# Patient Record
Sex: Female | Born: 1965 | Race: Black or African American | Hispanic: No | Marital: Single | Smoking: Current every day smoker
Health system: Southern US, Community
[De-identification: ages and names within clinical notes are randomized; demographics above are authoritative.]

## PROBLEM LIST (undated history)

## (undated) DIAGNOSIS — I2699 Other pulmonary embolism without acute cor pulmonale: Secondary | ICD-10-CM

## (undated) DIAGNOSIS — I82409 Acute embolism and thrombosis of unspecified deep veins of unspecified lower extremity: Secondary | ICD-10-CM

## (undated) HISTORY — PX: LAPAROSCOPIC GASTRIC SLEEVE RESECTION: SHX5895

## (undated) HISTORY — PX: PANCREAS SURGERY: SHX731

---

## 2017-01-17 ENCOUNTER — Other Ambulatory Visit: Payer: Self-pay | Admitting: Family Medicine

## 2017-01-17 DIAGNOSIS — Z1231 Encounter for screening mammogram for malignant neoplasm of breast: Secondary | ICD-10-CM

## 2017-01-31 ENCOUNTER — Ambulatory Visit: Payer: Self-pay

## 2017-03-14 ENCOUNTER — Emergency Department (HOSPITAL_BASED_OUTPATIENT_CLINIC_OR_DEPARTMENT_OTHER)
Admit: 2017-03-14 | Discharge: 2017-03-14 | Disposition: A | Discharge status: Home / Self Care | Payer: BLUE CROSS/BLUE SHIELD | Attending: Emergency Medicine | Admitting: Emergency Medicine

## 2017-03-14 ENCOUNTER — Emergency Department (HOSPITAL_COMMUNITY)
Admission: EM | Admit: 2017-03-14 | Discharge: 2017-03-14 | Disposition: A | Discharge status: Home / Self Care | Payer: BLUE CROSS/BLUE SHIELD | Attending: Emergency Medicine | Admitting: Emergency Medicine

## 2017-03-14 ENCOUNTER — Encounter (HOSPITAL_COMMUNITY): Payer: Self-pay | Admitting: Emergency Medicine

## 2017-03-14 DIAGNOSIS — M7989 Other specified soft tissue disorders: Secondary | ICD-10-CM

## 2017-03-14 DIAGNOSIS — M7122 Synovial cyst of popliteal space [Baker], left knee: Secondary | ICD-10-CM | POA: Insufficient documentation

## 2017-03-14 DIAGNOSIS — F17200 Nicotine dependence, unspecified, uncomplicated: Secondary | ICD-10-CM | POA: Insufficient documentation

## 2017-03-14 DIAGNOSIS — M79605 Pain in left leg: Secondary | ICD-10-CM | POA: Diagnosis present

## 2017-03-14 DIAGNOSIS — Z79899 Other long term (current) drug therapy: Secondary | ICD-10-CM | POA: Diagnosis not present

## 2017-03-14 HISTORY — DX: Acute embolism and thrombosis of unspecified deep veins of unspecified lower extremity: I82.409

## 2017-03-14 HISTORY — DX: Other pulmonary embolism without acute cor pulmonale: I26.99

## 2017-03-14 LAB — I-STAT CHEM 8, ED
BUN: 21 mg/dL — AB (ref 6–20)
CHLORIDE: 103 mmol/L (ref 101–111)
Calcium, Ion: 1.13 mmol/L — ABNORMAL LOW (ref 1.15–1.40)
Creatinine, Ser: 0.6 mg/dL (ref 0.44–1.00)
GLUCOSE: 96 mg/dL (ref 65–99)
HCT: 37 % (ref 36.0–46.0)
Hemoglobin: 12.6 g/dL (ref 12.0–15.0)
POTASSIUM: 4 mmol/L (ref 3.5–5.1)
Sodium: 143 mmol/L (ref 135–145)
TCO2: 26 mmol/L (ref 0–100)

## 2017-03-14 NOTE — ED Notes (Signed)
Venous US at bedside.  

## 2017-03-14 NOTE — Progress Notes (Signed)
*  PRELIMINARY RESULTS* Vascular Ultrasound Left lower extremity venous duplex has been completed.  Preliminary findings: No evidence of deep vein thrombosis in the left lower extremity.  Baker's cyst noted in the left popliteal fossa.  Preliminary results given to Dr. Dalene SeltzerSchlossman @ 14:44   Whitney Price 03/14/2017, 2:43 PM

## 2017-03-14 NOTE — ED Provider Notes (Signed)
[facility]-EMERGENCY DEPT Provider Note   CSN: [id number] Arrival date & time: 03/14/17  1131     History   Chief Complaint Chief Complaint  Patient presents with  . Leg Pain    HPI Whitney Price is a 51 y.o. female.  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 Reports that it has been present for about 6 weeks, however has been increasing. She has not yet seen her physician regarding this. Reports the pain is about a 5-6 out of 10. Also reports she's been having chronic daily headaches. Reports they haven't spontaneously, sometimes throughout the day. Denies nausea, vomiting, numbness, weakness, head trauma, fevers. Reports these have also been happening for the last 2 months.  Past Medical History:  Diagnosis Date  . DVT (deep venous thrombosis) (HCC)    Left DVT  . Pulmonary embolus (HCC)     There are no active problems to display for this patient.   Past Surgical History:  Procedure Laterality Date  . CESAREAN SECTION    . LAPAROSCOPIC GASTRIC SLEEVE RESECTION    . PANCREAS SURGERY     cyst removal    OB History    No data available       Home Medications    Prior to Admission medications   Medication Sig Start Date End Date Taking? Authorizing Provider  BIOTIN PO Take 1 tablet by mouth daily.   Yes [provider]  Cholecalciferol (VITAMIN D3 PO) Take 1 capsule by mouth daily.   Yes [provider]  esomeprazole (NEXIUM) 20 MG capsule Take 20 mg by mouth daily at 12 noon.   Yes [provider]  gabapentin (NEURONTIN) 300 MG capsule Take 1 capsule by mouth 3 (three) times daily.  02/24/17  Yes [provider]  MELATONIN PO Take 1 tablet by mouth daily.   Yes [provider]  tiZANidine (ZANAFLEX) 4 MG tablet Take 2 tablets by mouth at bedtime.  03/02/17  Yes [provider]  diclofenac (CATAFLAM) 50 MG tablet Take 1 tablet by mouth daily.  01/09/17   [provider]    Family History Family History  Problem Relation Age of Onset  . Cancer Mother   . Diabetes Mother   . Hypertension Mother   . Hypertension Father   . Cancer Father     Social History Social History  Substance Use Topics  . Smoking status: Current Every Day Smoker    Packs/day: 0.50  . Smokeless tobacco: Never Used  . Alcohol use Yes     Comment: occ     Allergies   Patient has no known allergies.   Review of Systems Review of Systems  Constitutional: Negative for fever.  HENT: Negative for sore throat.   Eyes: Negative for visual disturbance.  Respiratory: Negative for cough and shortness of breath.   Cardiovascular: Positive for leg swelling. Negative for chest pain.  Gastrointestinal: Negative for abdominal pain.  Genitourinary: Negative for difficulty urinating.  Musculoskeletal: Negative for back pain and neck pain.  Skin: Negative for rash.  Neurological: Positive for headaches. Negative for syncope.     Physical Exam Updated Vital Signs BP 127/63 (BP Location: Right Arm)   Pulse (!) 45   Temp 98.4 F (36.9 C)   Resp 18   Wt 76.7 kg (169 lb)   SpO2 100%   Physical Exam  Constitutional: She is oriented to person, place, and time. She  appears well-developed and well-nourished. No distress.  HENT:  Head: Normocephalic and atraumatic.  Eyes: Conjunctivae and EOM are normal.  Neck: Normal range of motion.  Cardiovascular: Normal rate, regular rhythm, normal heart sounds and intact distal pulses.  Exam reveals no gallop and no friction rub.   No murmur heard. Pulmonary/Chest: Effort normal and breath sounds normal. No respiratory distress. She has no wheezes. She has no rales.  Abdominal: Soft. She exhibits no distension. There is no tenderness. There is no guarding.  Musculoskeletal: She exhibits no edema or tenderness.  Neurological: She is alert and oriented to person, place, and time.  Skin: Skin is warm and  dry. No rash noted. She is not diaphoretic. No erythema.  Nursing note and vitals reviewed.    ED Treatments / Results  Labs (all labs ordered are listed, but only abnormal results are displayed) Labs Reviewed  I-STAT CHEM 8, ED - Abnormal; Notable for the following:       Result Value   BUN 21 (*)    Calcium, Ion 1.13 (*)    All other components within normal limits    EKG  EKG Interpretation None       Radiology No results found.  Procedures Procedures (including critical care time)  Medications Ordered in ED Medications - No data to display   Initial Impression / Assessment and Plan / ED Course  I have reviewed the triage vital signs and the nursing notes.  Pertinent labs & imaging results that were available during my care of the patient were reviewed by me and considered in my medical decision making (see chart for details).     51yo female with history of DVT/PE presents with concern for left lower extremity pain and swelling.  DVT study negative and shows baker's cyst. Patient also reports headaches which are intermittent.  Headache began slowly, no trauma, no fevers, and normal neurologic exam and have low suspicion for [facility], SDH or meningitis. Denies current dyspnea, chest pain. Reports occasional dyspnea at night however no orthopnea. History and exam do not show signs of CHF, pneumonia, pneumothorax. Doubt PE given intermittent symptoms, no exertional symptoms, no hypoxia, no tachycardia, no tachypnea, no DVT.  Recommend close PCP follow up, consideration of outpatient imaging for headaches.  Patient discharged in stable condition with understanding of reasons to return.   Final Clinical Impressions(s) / ED Diagnoses   Final diagnoses:  Baker's cyst of knee, left    New Prescriptions Discharge Medication List as of 03/14/2017  3:26 PM       Alvira MondaySchlossman, Josely Moffat, MD 03/14/17 2320

## 2017-03-14 NOTE — ED Triage Notes (Signed)
Pt reports pain in l/leg behind he l/knee x 6 weeks. Pt stated that she has been treated for DVT and PE years ago. Denies chest pain, denies shortness of breath. Pain and swelling  in leg has increased over last 2 days. Pt is alert, oriented and ambulatory. Pt is requesting an ultrasound. Has not seen PCP for this concern.

## 2019-01-21 ENCOUNTER — Other Ambulatory Visit: Payer: Self-pay

## 2019-01-21 ENCOUNTER — Emergency Department (HOSPITAL_COMMUNITY): Payer: BC Managed Care – PPO

## 2019-01-21 ENCOUNTER — Emergency Department (HOSPITAL_COMMUNITY)
Admission: EM | Admit: 2019-01-21 | Discharge: 2019-01-21 | Disposition: A | Discharge status: Home / Self Care | Payer: BC Managed Care – PPO | Attending: Emergency Medicine | Admitting: Emergency Medicine

## 2019-01-21 ENCOUNTER — Encounter (HOSPITAL_COMMUNITY): Payer: Self-pay | Admitting: Emergency Medicine

## 2019-01-21 DIAGNOSIS — Z79899 Other long term (current) drug therapy: Secondary | ICD-10-CM | POA: Diagnosis not present

## 2019-01-21 DIAGNOSIS — R072 Precordial pain: Secondary | ICD-10-CM | POA: Diagnosis not present

## 2019-01-21 DIAGNOSIS — R519 Headache, unspecified: Secondary | ICD-10-CM

## 2019-01-21 DIAGNOSIS — F17210 Nicotine dependence, cigarettes, uncomplicated: Secondary | ICD-10-CM | POA: Diagnosis not present

## 2019-01-21 DIAGNOSIS — R51 Headache: Secondary | ICD-10-CM | POA: Diagnosis present

## 2019-01-21 LAB — CBC WITH DIFFERENTIAL/PLATELET
Abs Immature Granulocytes: 0.01 10*3/uL (ref 0.00–0.07)
Basophils Absolute: 0 10*3/uL (ref 0.0–0.1)
Basophils Relative: 1 %
Eosinophils Absolute: 0.1 10*3/uL (ref 0.0–0.5)
Eosinophils Relative: 2 %
HCT: 36.4 % (ref 36.0–46.0)
Hemoglobin: 11.9 g/dL — ABNORMAL LOW (ref 12.0–15.0)
Immature Granulocytes: 0 %
Lymphocytes Relative: 41 %
Lymphs Abs: 1.8 10*3/uL (ref 0.7–4.0)
MCH: 31.1 pg (ref 26.0–34.0)
MCHC: 32.7 g/dL (ref 30.0–36.0)
MCV: 95 fL (ref 80.0–100.0)
Monocytes Absolute: 0.4 10*3/uL (ref 0.1–1.0)
Monocytes Relative: 9 %
Neutro Abs: 2 10*3/uL (ref 1.7–7.7)
Neutrophils Relative %: 47 %
Platelets: 183 10*3/uL (ref 150–400)
RBC: 3.83 MIL/uL — ABNORMAL LOW (ref 3.87–5.11)
RDW: 13.6 % (ref 11.5–15.5)
WBC: 4.3 10*3/uL (ref 4.0–10.5)
nRBC: 0 % (ref 0.0–0.2)

## 2019-01-21 LAB — BASIC METABOLIC PANEL
Anion gap: 7 (ref 5–15)
BUN: 26 mg/dL — ABNORMAL HIGH (ref 6–20)
CO2: 25 mmol/L (ref 22–32)
Calcium: 8.7 mg/dL — ABNORMAL LOW (ref 8.9–10.3)
Chloride: 108 mmol/L (ref 98–111)
Creatinine, Ser: 0.59 mg/dL (ref 0.44–1.00)
GFR calc Af Amer: >60 mL/min (ref >60)
GFR calc non Af Amer: >60 mL/min (ref >60)
Glucose, Bld: 94 mg/dL (ref 70–99)
Potassium: 4 mmol/L (ref 3.5–5.1)
Sodium: 140 mmol/L (ref 135–145)

## 2019-01-21 LAB — TROPONIN I
Troponin I: <0.03 ng/mL (ref <0.03)
Troponin I: <0.03 ng/mL (ref <0.03)

## 2019-01-21 MED ORDER — METOCLOPRAMIDE HCL 5 MG/ML IJ SOLN
10.0000 mg | Freq: Once | INTRAMUSCULAR | Status: AC
  Administered 2019-01-21: 10 mg via INTRAVENOUS
  Filled 2019-01-21: qty 2

## 2019-01-21 MED ORDER — DIPHENHYDRAMINE HCL 50 MG/ML IJ SOLN
25.0000 mg | Freq: Once | INTRAMUSCULAR | Status: AC
  Administered 2019-01-21: 25 mg via INTRAVENOUS
  Filled 2019-01-21: qty 1

## 2019-01-21 MED ORDER — SODIUM CHLORIDE 0.9 % IV BOLUS (SEPSIS)
1000.0000 mL | Freq: Once | INTRAVENOUS | Status: AC
  Administered 2019-01-21: 06:00:00 1000 mL via INTRAVENOUS

## 2019-01-21 NOTE — ED Provider Notes (Signed)
Headache improved at this time.  EKG without ischemic changes.  Troponin negative x2.  Sinus bradycardia.  Overall well-appearing.  No syncope.  Close primary care follow-up.  Patient understands return to the ER for new or worsening symptoms.   Jola Schmidt, MD 01/21/19 1010

## 2019-01-21 NOTE — ED Provider Notes (Signed)
[facility] DEPT Provider Note   CSN: [id number] Arrival date & time: 01/21/19  0136    History   Chief Complaint Chief Complaint  Patient presents with  . Headache    HPI Whitney Price is a 53 y.o. female.     The history is provided by the patient.  Headache  Pain location:  Generalized Quality:  Dull Onset quality:  Gradual Timing:  Constant Progression:  Unchanged Chronicity:  New Relieved by:  Nothing Worsened by:  Nothing Associated symptoms: no fever, no vomiting and no weakness   She presents for multiple complaints.  She reports approximately 5 hours ago she had onset of generalized headache, numbness in her fingers, chest heaviness and shortness of breath, and left groin pain. No fevers or vomiting  she reports history of migraines, but this headache felt different.  She does not recall having this chest pain before.  She reports distant history of a PE but this is not similar.  She is no longer on anticoagulants as it was over 10 years ago She reports numbness is improved, but still has headache and chest heaviness Past Medical History:  Diagnosis Date  . DVT (deep venous thrombosis) (HCC)    Left DVT  . Pulmonary embolus (HCC)     There are no active problems to display for this patient.   Past Surgical History:  Procedure Laterality Date  . CESAREAN SECTION    . LAPAROSCOPIC GASTRIC SLEEVE RESECTION    . PANCREAS SURGERY     cyst removal     OB History   No obstetric history on file.      Home Medications    Prior to Admission medications   Medication Sig Start Date End Date Taking? Authorizing Provider  celecoxib (CELEBREX) 200 MG capsule Take 200 mg by mouth 2 (two) times a day. 01/15/19  Yes [provider]  clonazePAM (KLONOPIN) 0.5 MG tablet Take 0.5 mg by mouth 2 (two) times daily as needed for anxiety.  10/27/18  Yes [provider]  esomeprazole (NEXIUM) 20 MG capsule Take 20 mg by mouth  daily after breakfast.    Yes [provider]  gabapentin (NEURONTIN) 300 MG capsule Take 300 mg by mouth 3 (three) times daily as needed (pain).  02/24/17  Yes [provider]  hydrOXYzine (ATARAX/VISTARIL) 25 MG tablet Take 25 mg by mouth every 6 (six) hours as needed. 11/03/18  Yes [provider]  tiZANidine (ZANAFLEX) 4 MG tablet Take 4 tablets by mouth at bedtime.  03/02/17  Yes [provider]    Family History Family History  Problem Relation Age of Onset  . Cancer Mother   . Diabetes Mother   . Hypertension Mother   . Hypertension Father   . Cancer Father     Social History Social History   Tobacco Use  . Smoking status: Current Every Day Smoker    Packs/day: 0.50  . Smokeless tobacco: Never Used  Substance Use Topics  . Alcohol use: Yes    Comment: occ  . Drug use: No     Allergies   Patient has no known allergies.   Review of Systems Review of Systems  Constitutional: Negative for fever.  Respiratory: Positive for shortness of breath.   Gastrointestinal: Negative for vomiting.  Neurological: Positive for headaches. Negative for syncope and weakness.  All other systems reviewed and are negative.    Physical Exam Updated Vital Signs BP 117/79   Pulse (!) 54  Temp 98.4 F (36.9 C) (Oral)   Resp 15   Ht 1.549 m (5\' 1" )   Wt 72.1 kg   SpO2 95%   BMI 30.04 kg/m   Physical Exam CONSTITUTIONAL: Well developed/well nourished HEAD: Normocephalic/atraumatic EYES: EOMI/PERRL, no nystagmus, no ptosis  ENMT: Mucous membranes moist NECK: supple no meningeal signs, no bruits SPINE/BACK:entire spine nontender CV: S1/S2 noted, no murmurs/rubs/gallops noted LUNGS: Lungs are clear to auscultation bilaterally, no apparent distress ABDOMEN: soft, nontender, no rebound or guarding GU:no cva tenderness NEURO:Awake/alert, face symmetric, no arm or leg drift is noted Equal 5/5 strength with shoulder abduction, elbow  flex/extension, wrist flex/extension in upper extremities and equal hand grips bilaterally Equal 5/5 strength with hip flexion,knee flex/extension, foot dorsi/plantar flexion Cranial nerves 3/4/5/6/02/20/09/11/12 tested and intact Sensation to light touch intact in all extremities EXTREMITIES: pulses normal, full ROM, no LE edema/tenderness Mild tenderness to left inguinal region, no erythema/warmth/lymphadenopathy.  Female nurse chaperone present SKIN: warm, color normal PSYCH: no abnormalities of mood noted, alert and oriented to situation    ED Treatments / Results  Labs (all labs ordered are listed, but only abnormal results are displayed) Labs Reviewed  BASIC METABOLIC PANEL - Abnormal; Notable for the following components:      Result Value   BUN 26 (*)    Calcium 8.7 (*)    All other components within normal limits  CBC WITH DIFFERENTIAL/PLATELET - Abnormal; Notable for the following components:   RBC 3.83 (*)    Hemoglobin 11.9 (*)    All other components within normal limits  TROPONIN I    EKG EKG Interpretation  Date/Time:  Monday January 21 2019 05:34:45 EDT Ventricular Rate:  46 PR Interval:    QRS Duration: 91 QT Interval:  469 QTC Calculation: 411 R Axis:   2 Text Interpretation:  Sinus bradycardia Anteroseptal infarct, age indeterminate Abnormal ekg No previous ECGs available Confirmed by Zadie RhineWickline, Axell Trigueros ([id number]) on 01/21/2019 5:42:19 AM   Radiology Dg Chest Port 1 View  Result Date: 01/21/2019 CLINICAL DATA:  Headaches. Left-sided groin pain. Numbness in the finger tips. EXAM: PORTABLE CHEST 1 VIEW COMPARISON:  None. FINDINGS: The heart size is normal. Lung volumes are low. There is no edema or effusion. No focal airspace disease is present. The visualized soft tissues and bony thorax are unremarkable. IMPRESSION: Negative one-view chest x-ray Electronically Signed   By: Marin Robertshristopher  Mattern M.D.   On: 01/21/2019 06:32  reviewed chest x-ray with radiology, no acute  findings.  Procedures Procedures    Medications Ordered in ED Medications  metoCLOPramide (REGLAN) injection 10 mg (10 mg Intravenous Given 01/21/19 0554)  sodium chloride 0.9 % bolus 1,000 mL (1,000 mLs Intravenous New Bag/Given 01/21/19 0605)  diphenhydrAMINE (BENADRYL) injection 25 mg (25 mg Intravenous Given 01/21/19 0605)     Initial Impression / Assessment and Plan / ED Course  I have reviewed the triage vital signs and the nursing notes.  Pertinent labs & imaging results that were available during my care of the patient were reviewed by me and considered in my medical decision making (see chart for details).        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 6:53 AM Patient reports that she had [place] cardiac [place] in [place] in [place]. 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  She reports she had no blockages 7:11 AM Feeling improved. She is in no acute distress.  EKG shows T wave emergency, but no old to compare.  Initial troponin negative. Plan at signout to Dr. Patria Maneampos is to follow-up on repeat troponin.  If negative she can follow-up as an outpatient Final Clinical Impressions(s) / ED Diagnoses   Final diagnoses:  None    ED Discharge Orders    None       Zadie RhineWickline, Senaya Dicenso, MD 01/21/19 [phone]

## 2019-01-21 NOTE — ED Triage Notes (Signed)
Pt presents by [facility] for evaluation of headache with left sided groin pain and numbness in fingertips. Pt reports headache that started around 2300.

## 2019-01-21 NOTE — ED Notes (Signed)
ED Provider at bedside. Assisted provider with physical exam.

## 2019-05-10 ENCOUNTER — Other Ambulatory Visit: Payer: Self-pay

## 2019-05-10 ENCOUNTER — Emergency Department (HOSPITAL_COMMUNITY)
Admission: EM | Admit: 2019-05-10 | Discharge: 2019-05-10 | Disposition: A | Discharge status: Home / Self Care | Payer: BC Managed Care – PPO | Attending: Emergency Medicine | Admitting: Emergency Medicine

## 2019-05-10 ENCOUNTER — Encounter (HOSPITAL_COMMUNITY): Payer: Self-pay

## 2019-05-10 DIAGNOSIS — M549 Dorsalgia, unspecified: Secondary | ICD-10-CM | POA: Diagnosis present

## 2019-05-10 DIAGNOSIS — Z9884 Bariatric surgery status: Secondary | ICD-10-CM | POA: Diagnosis not present

## 2019-05-10 DIAGNOSIS — M545 Low back pain, unspecified: Secondary | ICD-10-CM

## 2019-05-10 DIAGNOSIS — Z86711 Personal history of pulmonary embolism: Secondary | ICD-10-CM | POA: Diagnosis not present

## 2019-05-10 DIAGNOSIS — Z79899 Other long term (current) drug therapy: Secondary | ICD-10-CM | POA: Insufficient documentation

## 2019-05-10 DIAGNOSIS — Z86718 Personal history of other venous thrombosis and embolism: Secondary | ICD-10-CM | POA: Insufficient documentation

## 2019-05-10 DIAGNOSIS — F17210 Nicotine dependence, cigarettes, uncomplicated: Secondary | ICD-10-CM | POA: Diagnosis not present

## 2019-05-10 MED ORDER — HYDROCODONE-ACETAMINOPHEN 5-325 MG PO TABS: 1.0000 | ORAL_TABLET | Freq: Four times a day (QID) | ORAL | 0 refills | Status: AC | PRN

## 2019-05-10 MED ORDER — METHYLPREDNISOLONE 4 MG PO TBPK: ORAL_TABLET | ORAL | 0 refills | Status: AC

## 2019-05-10 NOTE — ED Provider Notes (Signed)
[facility] EMERGENCY DEPARTMENT Provider Note   CSN: [id number] Arrival date & time: 05/10/19  1340     History   Chief Complaint Chief Complaint  Patient presents with  . Back Pain    HPI Whitney Price is a 53 y.o. female.     The history is provided by the patient and medical records. No language interpreter was used.   Whitney Price is a 53 y.o. female  with a PMH as listed below who presents to the Emergency Department complaining of persistent low back pain 3 weeks.  Patient reports history of similar.  Does feel similar to her previous, however typically resolves with OTC medications.  She has been taking Tylenol and using BenGay with little improvement.  Patient denies upper back or neck pain. No fever, saddle anesthesia, weakness, numbness, urinary complaints including retention/incontinence. No history of cancer, IVDU, or recent spinal procedures.  Had a virtual visit with her primary care doctor on 9/14 where she was told to continue Tylenol/ibuprofen and tramadol was added into regimen.  MRI was ordered and per patient, has been approved by [employer], but has not been scheduled yet.  Past Medical History:  Diagnosis Date  . DVT (deep venous thrombosis) (HCC)    Left DVT  . Pulmonary embolus (HCC)     There are no active problems to display for this patient.   Past Surgical History:  Procedure Laterality Date  . CESAREAN SECTION    . LAPAROSCOPIC GASTRIC SLEEVE RESECTION    . PANCREAS SURGERY     cyst removal     OB History   No obstetric history on file.      Home Medications    Prior to Admission medications   Medication Sig Start Date End Date Taking? Authorizing Provider  celecoxib (CELEBREX) 200 MG capsule Take 200 mg by mouth 2 (two) times a day. 01/15/19   [provider]  clonazePAM (KLONOPIN) 0.5 MG tablet Take 0.5 mg by mouth 2 (two) times daily as needed for anxiety.  10/27/18   [provider]   esomeprazole (NEXIUM) 20 MG capsule Take 20 mg by mouth daily after breakfast.     [provider]  gabapentin (NEURONTIN) 300 MG capsule Take 300 mg by mouth 3 (three) times daily as needed (pain).  02/24/17   [provider]  HYDROcodone-acetaminophen (NORCO) 5-325 MG tablet Take 1 tablet by mouth every 6 (six) hours as needed for moderate pain. 05/10/19   Robbi Scurlock, Chase Picket, PA-C  hydrOXYzine (ATARAX/VISTARIL) 25 MG tablet Take 25 mg by mouth every 6 (six) hours as needed. 11/03/18   [provider]  methylPREDNISolone (MEDROL DOSEPAK) 4 MG TBPK tablet Take as directed on package. 05/10/19   Njeri Vicente, Chase Picket, PA-C  tiZANidine (ZANAFLEX) 4 MG tablet Take 4 tablets by mouth at bedtime.  03/02/17   [provider]    Family History Family History  Problem Relation Age of Onset  . Cancer Mother   . Diabetes Mother   . Hypertension Mother   . Hypertension Father   . Cancer Father     Social History Social History   Tobacco Use  . Smoking status: Current Every Day Smoker    Packs/day: 0.50  . Smokeless tobacco: Never Used  Substance Use Topics  . Alcohol use: Yes    Comment: occ  . Drug use: No     Allergies   Patient has no known allergies.   Review of Systems Review of  Systems  Constitutional: Negative for chills and fever.  Cardiovascular: Negative for chest pain.  Gastrointestinal: Negative for abdominal pain, nausea and vomiting.  Genitourinary: Negative for difficulty urinating, dysuria and frequency.  Musculoskeletal: Positive for back pain. Negative for neck pain.  Skin: Negative for color change and wound.     Physical Exam Updated Vital Signs BP (!) 146/79 (BP Location: Right Arm)   Pulse (!) 54   Temp 99.2 F (37.3 C) (Oral)   Resp 15   SpO2 100%   Physical Exam Vitals signs and nursing note reviewed.  Constitutional:      Appearance: She is well-developed.  Neck:     Comments: No midline or paraspinal  tenderness. Full ROM without pain. Cardiovascular:     Rate and Rhythm: Normal rate and regular rhythm.     Heart sounds: Normal heart sounds.  Pulmonary:     Effort: Pulmonary effort is normal. No respiratory distress.     Breath sounds: Normal breath sounds.  Abdominal:     General: Bowel sounds are normal. There is no distension.     Palpations: Abdomen is soft.     Tenderness: There is no abdominal tenderness.  Musculoskeletal:       Back:     Comments: Diffuse tenderness to the T and L-spine as depicted in image. 5/5 muscle strength in bilateral LE's. Straight leg raises are negative bilaterally for radicular symptoms. Able to ambulate independently with steady gait.  Skin:    General: Skin is warm and dry.     Findings: No erythema or rash.  Neurological:     Mental Status: She is alert and oriented to person, place, and time.     Deep Tendon Reflexes: Reflexes are normal and symmetric.     Comments: Bilateral lower extremities neurovascularly intact.      ED Treatments / Results  Labs (all labs ordered are listed, but only abnormal results are displayed) Labs Reviewed - No data to display  EKG None  Radiology No results found.  Procedures Procedures (including critical care time)  Medications Ordered in ED Medications - No data to display   Initial Impression / Assessment and Plan / ED Course  I have reviewed the triage vital signs and the nursing notes.  Pertinent labs & imaging results that were available during my care of the patient were reviewed by me and considered in my medical decision making (see chart for details).       Whitney Price is a 53 y.o. female who presents to ED for mid-to-lower back pain. Patient demonstrates no lower extremity weakness, saddle anesthesia, bowel or bladder incontinence or neuro deficits. No concern for cauda equina. No fevers or other infectious symptoms to suggest that the patient's back pain is due to an infection.  Lower extremities are neurovascularly intact and patient is ambulating without difficulty.  She does have a primary care doctor who has ordered an outpatient MRI which is what I feel she ultimately needs.  No indication for emergent MRI to be done today.  We discussed x-rays which she has had in the past, but has been several years.  Shared decision making and have agreed that ultimately she does need MRI and will hold off on doing x-rays today.  I have reviewed return precautions, including the development of any of these signs or symptoms and the patient has voiced understanding. I reviewed symptomatic home care instructions and PCP follow-up if symptoms do not improve. RX for medrol dose pack and  short course of norco. Patient voiced understanding and agreement with plan. All questions answered.   Final Clinical Impressions(s) / ED Diagnoses   Final diagnoses:  Bilateral low back pain without sciatica, unspecified chronicity    ED Discharge Orders         Ordered    methylPREDNISolone (MEDROL DOSEPAK) 4 MG TBPK tablet     05/10/19 1544    HYDROcodone-acetaminophen (NORCO) 5-325 MG tablet  Every 6 hours PRN     05/10/19 1544           Chaundra Abreu, Chase PicketJaime Pilcher, PA-C 05/10/19 1557    Virgina Norfolkuratolo, Adam, DO 05/10/19 1715

## 2019-05-10 NOTE — Discharge Instructions (Signed)
It was my pleasure taking care of you today!   Call your primary care doctor on Monday to schedule a follow up appointment.   Return to the ED for worsening back pain, fever, weakness or numbness of either leg, or if you develop either (1) an inability to urinate or have bowel movements, or (2) loss of your ability to control your bathroom functions (if you start having "accidents"), or if you develop other new symptoms that concern you.

## 2019-05-10 NOTE — ED Notes (Signed)
Patient verbalizes understanding of discharge instructions. Opportunity for questioning and answers were provided. Armband removed by staff, pt discharged from ED.  

## 2019-05-10 NOTE — ED Triage Notes (Signed)
Pt c.o lower back pain for 3 weeks but worse today. Worse with movement. Denies any recent falls or injury.

## 2019-08-14 ENCOUNTER — Emergency Department (HOSPITAL_COMMUNITY): Payer: BC Managed Care – PPO

## 2019-08-14 ENCOUNTER — Emergency Department (HOSPITAL_COMMUNITY)
Admission: EM | Admit: 2019-08-14 | Discharge: 2019-08-14 | Disposition: A | Discharge status: Home / Self Care | Payer: BC Managed Care – PPO | Attending: Emergency Medicine | Admitting: Emergency Medicine

## 2019-08-14 ENCOUNTER — Encounter (HOSPITAL_COMMUNITY): Payer: Self-pay | Admitting: Emergency Medicine

## 2019-08-14 ENCOUNTER — Other Ambulatory Visit: Payer: Self-pay

## 2019-08-14 DIAGNOSIS — Z79899 Other long term (current) drug therapy: Secondary | ICD-10-CM | POA: Diagnosis not present

## 2019-08-14 DIAGNOSIS — K5792 Diverticulitis of intestine, part unspecified, without perforation or abscess without bleeding: Secondary | ICD-10-CM

## 2019-08-14 DIAGNOSIS — R110 Nausea: Secondary | ICD-10-CM | POA: Diagnosis not present

## 2019-08-14 DIAGNOSIS — F17210 Nicotine dependence, cigarettes, uncomplicated: Secondary | ICD-10-CM | POA: Insufficient documentation

## 2019-08-14 DIAGNOSIS — R630 Anorexia: Secondary | ICD-10-CM | POA: Diagnosis not present

## 2019-08-14 DIAGNOSIS — R1031 Right lower quadrant pain: Secondary | ICD-10-CM

## 2019-08-14 LAB — URINALYSIS, ROUTINE W REFLEX MICROSCOPIC
Bilirubin Urine: NEGATIVE
Glucose, UA: NEGATIVE mg/dL
Hgb urine dipstick: NEGATIVE
Ketones, ur: NEGATIVE mg/dL
Nitrite: POSITIVE — AB
Protein, ur: NEGATIVE mg/dL
Specific Gravity, Urine: 1.026 (ref 1.005–1.030)
pH: 5 (ref 5.0–8.0)

## 2019-08-14 LAB — CBC
HCT: 36 % (ref 36.0–46.0)
Hemoglobin: 12 g/dL (ref 12.0–15.0)
MCH: 32.1 pg (ref 26.0–34.0)
MCHC: 33.3 g/dL (ref 30.0–36.0)
MCV: 96.3 fL (ref 80.0–100.0)
Platelets: 162 10*3/uL (ref 150–400)
RBC: 3.74 MIL/uL — ABNORMAL LOW (ref 3.87–5.11)
RDW: 13.8 % (ref 11.5–15.5)
WBC: 5.7 10*3/uL (ref 4.0–10.5)
nRBC: 0 % (ref 0.0–0.2)

## 2019-08-14 LAB — COMPREHENSIVE METABOLIC PANEL
ALT: 11 U/L (ref 0–44)
AST: 16 U/L (ref 15–41)
Albumin: 3.6 g/dL (ref 3.5–5.0)
Alkaline Phosphatase: 45 U/L (ref 38–126)
Anion gap: 8 (ref 5–15)
BUN: 16 mg/dL (ref 6–20)
CO2: 29 mmol/L (ref 22–32)
Calcium: 8.7 mg/dL — ABNORMAL LOW (ref 8.9–10.3)
Chloride: 102 mmol/L (ref 98–111)
Creatinine, Ser: 0.64 mg/dL (ref 0.44–1.00)
GFR calc Af Amer: >60 mL/min (ref >60)
GFR calc non Af Amer: >60 mL/min (ref >60)
Glucose, Bld: 93 mg/dL (ref 70–99)
Potassium: 3.7 mmol/L (ref 3.5–5.1)
Sodium: 139 mmol/L (ref 135–145)
Total Bilirubin: 0.7 mg/dL (ref 0.3–1.2)
Total Protein: 6.5 g/dL (ref 6.5–8.1)

## 2019-08-14 LAB — LIPASE, BLOOD: Lipase: 29 U/L (ref 11–51)

## 2019-08-14 LAB — I-STAT BETA HCG BLOOD, ED (MC, WL, AP ONLY): I-stat hCG, quantitative: <5 m[IU]/mL (ref <5)

## 2019-08-14 MED ORDER — OXYCODONE-ACETAMINOPHEN 5-325 MG PO TABS: 1.0000 | ORAL_TABLET | ORAL | 0 refills | Status: AC | PRN

## 2019-08-14 MED ORDER — SODIUM CHLORIDE (PF) 0.9 % IJ SOLN
INTRAMUSCULAR | Status: AC
  Filled 2019-08-14: qty 50

## 2019-08-14 MED ORDER — METRONIDAZOLE 500 MG PO TABS: 500.0000 mg | ORAL_TABLET | Freq: Three times a day (TID) | ORAL | 0 refills | Status: DC

## 2019-08-14 MED ORDER — OXYCODONE-ACETAMINOPHEN 5-325 MG PO TABS: 1.0000 | ORAL_TABLET | ORAL | 0 refills | Status: DC | PRN

## 2019-08-14 MED ORDER — METRONIDAZOLE 500 MG PO TABS: 500.0000 mg | ORAL_TABLET | Freq: Three times a day (TID) | ORAL | 0 refills | Status: AC

## 2019-08-14 MED ORDER — IOHEXOL 300 MG/ML  SOLN
100.0000 mL | Freq: Once | INTRAMUSCULAR | Status: AC | PRN
  Administered 2019-08-14: 100 mL via INTRAVENOUS

## 2019-08-14 MED ORDER — SENNOSIDES-DOCUSATE SODIUM 8.6-50 MG PO TABS: 1.0000 | ORAL_TABLET | Freq: Every day | ORAL | 0 refills | Status: AC

## 2019-08-14 MED ORDER — CIPROFLOXACIN HCL 500 MG PO TABS: 500.0000 mg | ORAL_TABLET | Freq: Two times a day (BID) | ORAL | 0 refills | Status: DC

## 2019-08-14 MED ORDER — MORPHINE SULFATE (PF) 4 MG/ML IV SOLN
4.0000 mg | Freq: Once | INTRAVENOUS | Status: AC
Start: 2019-08-14 — End: 2019-08-14
  Administered 2019-08-14: 4 mg via INTRAVENOUS
  Filled 2019-08-14: qty 1

## 2019-08-14 MED ORDER — CIPROFLOXACIN HCL 500 MG PO TABS: 500.0000 mg | ORAL_TABLET | Freq: Two times a day (BID) | ORAL | 0 refills | Status: AC

## 2019-08-14 MED ORDER — SODIUM CHLORIDE 0.9 % IV BOLUS
1000.0000 mL | Freq: Once | INTRAVENOUS | Status: AC
  Administered 2019-08-14: 1000 mL via INTRAVENOUS

## 2019-08-14 MED ORDER — SENNOSIDES-DOCUSATE SODIUM 8.6-50 MG PO TABS: 1.0000 | ORAL_TABLET | Freq: Every day | ORAL | 0 refills | Status: DC

## 2019-08-14 NOTE — Discharge Instructions (Signed)
I have prescribed antibiotics to help treat your infection.  Flagyl should be taken, 1 tablet 3 times a day for the next 7 days.  Cipro should be taken 1 tablet 2 times a day for the next 7 days.  I have also prescribed percocet for your pain. Please take this medication with Senna as it can make you constipated.   If you experience any nausea, vomiting, fever please return to the emergency department.

## 2019-08-14 NOTE — ED Provider Notes (Signed)
[facility]-EMERGENCY DEPT Provider Note   CSN: [id number] Arrival date & time: 08/14/19  1603     History Chief Complaint  Patient presents with  . Abdominal Pain    Whitney Price is a 53 y.o. female.  53 y.o female with a PMH of DVT, PE resents to the ED with a chief complaint of abdominal pain x3 days.  Patient describes a constant stabbing sensation to the right lower quadrant, she reports this pain is exacerbated with palpation, movement, walking.  Patient was evaluated by PCP today in office, was sent to urgent care however due to gravity of situation she was referred to the ED via EMS.  Reports taking Tylenol with codeine to help with her pain without improvement in symptoms.  Does endorse some anorexia, states her last meal was yesterday and basically only consisted of snacks.  She is also had some nausea, chills.  Her last bowel movement was in the morning without any blood.  According to patient she mostly stays at home, has not been around anybody who is been sick.  There is no alleviating factors.  She denies any urinary symptoms, fever, gynecological complaints.  Of note, she does have a prior history of a C-section, along with bariatric intervention.   The history is provided by the patient and medical records.  Abdominal Pain Pain location:  RLQ Pain quality: sharp   Pain radiates to:  Does not radiate Pain severity:  Moderate Onset quality:  Sudden Duration:  3 days Timing:  Constant Progression:  Worsening Context: not diet changes and not laxative use   Associated symptoms: nausea and vomiting   Associated symptoms: no chest pain, no diarrhea, no fever, no shortness of breath and no sore throat        Past Medical History:  Diagnosis Date  . DVT (deep venous thrombosis) (HCC)    Left DVT  . Pulmonary embolus (HCC)     There are no problems to display for this patient.   Past Surgical History:  Procedure Laterality Date  . CESAREAN  SECTION    . LAPAROSCOPIC GASTRIC SLEEVE RESECTION    . PANCREAS SURGERY     cyst removal     OB History   No obstetric history on file.     Family History  Problem Relation Age of Onset  . Cancer Mother   . Diabetes Mother   . Hypertension Mother   . Hypertension Father   . Cancer Father     Social History   Tobacco Use  . Smoking status: Current Every Day Smoker    Packs/day: 0.50  . Smokeless tobacco: Never Used  Substance Use Topics  . Alcohol use: Yes    Comment: occ  . Drug use: No    Home Medications Prior to Admission medications   Medication Sig Start Date End Date Taking? Authorizing Provider  celecoxib (CELEBREX) 200 MG capsule Take 200 mg by mouth 2 (two) times a day. 01/15/19   [provider]  ciprofloxacin (CIPRO) 500 MG tablet Take 1 tablet (500 mg total) by mouth 2 (two) times daily for 7 days. 08/14/19 08/21/19  Claude Manges, PA-C  clonazePAM (KLONOPIN) 0.5 MG tablet Take 0.5 mg by mouth 2 (two) times daily as needed for anxiety.  10/27/18   [provider]  esomeprazole (NEXIUM) 20 MG capsule Take 20 mg by mouth daily after breakfast.     [provider]  gabapentin (NEURONTIN) 300 MG capsule Take 300 mg by  mouth 3 (three) times daily as needed (pain).  02/24/17   [provider]  HYDROcodone-acetaminophen (NORCO) 5-325 MG tablet Take 1 tablet by mouth every 6 (six) hours as needed for moderate pain. 05/10/19   Ward, Chase PicketJaime Pilcher, PA-C  hydrOXYzine (ATARAX/VISTARIL) 25 MG tablet Take 25 mg by mouth every 6 (six) hours as needed. 11/03/18   [provider]  methylPREDNISolone (MEDROL DOSEPAK) 4 MG TBPK tablet Take as directed on package. 05/10/19   Ward, Chase PicketJaime Pilcher, PA-C  metroNIDAZOLE (FLAGYL) 500 MG tablet Take 1 tablet (500 mg total) by mouth 3 (three) times daily for 7 days. 08/14/19 08/21/19  Claude MangesSoto, Anjelica Gorniak, PA-C  oxyCODONE-acetaminophen (PERCOCET/ROXICET) 5-325 MG tablet Take 1 tablet by mouth every 4 (four) hours  as needed for up to 3 days for severe pain. 08/14/19 08/17/19  Claude MangesSoto, Shanetha Bradham, PA-C  senna-docusate (SENOKOT-S) 8.6-50 MG tablet Take 1 tablet by mouth daily for 10 days. 08/14/19 08/24/19  Claude MangesSoto, Maxwell Lemen, PA-C  tiZANidine (ZANAFLEX) 4 MG tablet Take 4 tablets by mouth at bedtime.  03/02/17   [provider]    Allergies    Patient has no known allergies.  Review of Systems   Review of Systems  Constitutional: Negative for fever.  HENT: Negative for rhinorrhea and sore throat.   Respiratory: Negative for shortness of breath.   Cardiovascular: Negative for chest pain.  Gastrointestinal: Positive for abdominal pain, nausea and vomiting. Negative for blood in stool and diarrhea.  Genitourinary: Negative for flank pain.  Musculoskeletal: Negative for back pain.  Skin: Negative for pallor and wound.  Neurological: Negative for headaches.    Physical Exam Updated Vital Signs BP 107/67 (BP Location: Right Arm)   Pulse 60   Temp 98.9 F (37.2 C) (Oral)   Resp 18   Ht 5\' 1"  (1.549 m)   Wt 72.6 kg   SpO2 96%   BMI 30.23 kg/m   Physical Exam Vitals and nursing note reviewed.  Constitutional:      General: She is not in acute distress.    Appearance: She is well-developed.  HENT:     Head: Normocephalic and atraumatic.     Mouth/Throat:     Pharynx: No oropharyngeal exudate.  Eyes:     Pupils: Pupils are equal, round, and reactive to light.  Cardiovascular:     Rate and Rhythm: Regular rhythm.     Heart sounds: Normal heart sounds.  Pulmonary:     Effort: Pulmonary effort is normal. No respiratory distress.     Breath sounds: Normal breath sounds.  Abdominal:     General: Abdomen is flat. Bowel sounds are decreased. There is no distension.     Palpations: Abdomen is soft.     Tenderness: There is abdominal tenderness in the right lower quadrant. There is no right CVA tenderness or left CVA tenderness. Positive signs include McBurney's sign.     Hernia: No hernia is present.   Musculoskeletal:        General: No tenderness or deformity.     Cervical back: Normal range of motion.     Right lower leg: No edema.     Left lower leg: No edema.  Skin:    General: Skin is warm and dry.  Neurological:     Mental Status: She is alert and oriented to person, place, and time.     ED Results / Procedures / Treatments   Labs (all labs ordered are listed, but only abnormal results are displayed) Labs Reviewed  COMPREHENSIVE  METABOLIC PANEL - Abnormal; Notable for the following components:      Result Value   Calcium 8.7 (*)    All other components within normal limits  CBC - Abnormal; Notable for the following components:   RBC 3.74 (*)    All other components within normal limits  URINALYSIS, ROUTINE W REFLEX MICROSCOPIC - Abnormal; Notable for the following components:   Color, Urine AMBER (*)    APPearance HAZY (*)    Nitrite POSITIVE (*)    Leukocytes,Ua TRACE (*)    Bacteria, UA MANY (*)    All other components within normal limits  URINE CULTURE  LIPASE, BLOOD  I-STAT BETA HCG BLOOD, ED ([facility], [facility], [facility] ONLY)    EKG None  Radiology CT ABDOMEN PELVIS W CONTRAST  Result Date: 08/14/2019 CLINICAL DATA:  Right lower quadrant pain EXAM: CT ABDOMEN AND PELVIS WITH CONTRAST TECHNIQUE: Multidetector CT imaging of the abdomen and pelvis was performed using the standard protocol following bolus administration of intravenous contrast. CONTRAST:  18mL OMNIPAQUE IOHEXOL 300 MG/ML  SOLN COMPARISON:  None. FINDINGS: Lower chest: Lung bases demonstrate hazy dependent atelectasis. No consolidation or effusion. Borderline heart size. Small hiatal hernia. Hepatobiliary: No focal liver abnormality is seen. No gallstones, gallbladder wall thickening, or biliary dilatation. Pancreas: Unremarkable. No pancreatic ductal dilatation or surrounding inflammatory changes. Spleen: Normal in size without focal abnormality. Adrenals/Urinary Tract: Adrenal glands are unremarkable. Kidneys  are normal, without renal calculi, focal lesion, or hydronephrosis. Bladder is unremarkable. Stomach/Bowel: Postsurgical changes of the stomach. No dilated small bowel. Negative appendix. Focal wall thickening involving the proximal ascending colon with surrounding inflammation. Questionable diverticulum in the region, series 2, image number 85. Vascular/Lymphatic: Nonaneurysmal aorta. No significantly enlarged lymph nodes Reproductive: Uterus is unremarkable.  3.5 cm right adnexal cyst. Other: Negative for free air.  Trace fluid in the right gutter Musculoskeletal: Degenerative changes. No acute or suspicious osseous abnormality IMPRESSION: 1. Negative for acute appendicitis. 2. Focal wall thickening at the proximal ascending colon with mild surrounding inflammatory changes and soft tissue thickening at the right gutter. Possible diverticulum in the region. Differential considerations include right-sided acute diverticulitis, focal area of colitis, and inflammatory mass. Consider correlation with direct visualization following resolution of acute illness. 3. 3.5 cm right adnexal cyst. Ultrasound follow-up/evaluation should be based on menopausal status. Electronically Signed   By: Donavan Foil M.D.   On: 08/14/2019 21:04    Procedures Procedures (including critical care time)  Medications Ordered in ED Medications  sodium chloride (PF) 0.9 % injection (has no administration in time range)  sodium chloride 0.9 % bolus 1,000 mL (1,000 mLs Intravenous New Bag/Given (Non-Interop) 08/14/19 2020)  morphine 4 MG/ML injection 4 mg (4 mg Intravenous Given 08/14/19 2020)  iohexol (OMNIPAQUE) 300 MG/ML solution 100 mL (100 mLs Intravenous Contrast Given 08/14/19 2030)    ED Course  I have reviewed the triage vital signs and the nursing notes.  Pertinent labs & imaging results that were available during my care of the patient were reviewed by me and considered in my medical decision making (see chart for  details).  Clinical Course as of Aug 13 2230  Wed Aug 14, 2019  2206 Nitrite(!): POSITIVE [JS]  2206 Leukocytes,Ua(!): TRACE [JS]    Clinical Course User Index [JS] Janeece Fitting, PA-C   MDM Rules/Calculators/A&P   Patient with a PMH of C section, bariatric intervention presents to the ED with sudden onset of abdominal pain in the right lower quadrant 3 days.  Evaluated  by PCP earlier, referred to the ED for further evaluation to rule out appendicitis.  Patient reports that sharp pain to the right lower quadrant, this has been constant.  She also endorses some nausea, no vomiting but has had anorexia.  Ports she had some snacks yesterday, along with her last meal.  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  During my evaluation, bowel sounds are diminished, there is tenderness along McBurney's point.  Denies any urinary symptoms, diarrhea, blood in her stool.  CBC is without any leukocytosis.  CMP without any electrolyte abnormality.  Kidney function is unremarkable, denies any urinary symptoms at this time.  LFTs are unremarkable, pain seems to be located more so in the lower quadrant, no tenderness of the right upper quadrant, lower suspicion for gallbladder pathology.  Pregnancy test is negative.  UA is currently pending.  She was provided with fluids, Zofran, morphine to help with her symptoms.  Will further evaluate patient via CT scan.  Differential diagnosis included but not limited to appendicitis, nephrolithiasis, gastroenteritis.  CT abdomen pelvis showed:  1. Negative for acute appendicitis.  2. Focal wall thickening at the proximal ascending colon with mild  surrounding inflammatory changes and soft tissue thickening at the  right gutter. Possible diverticulum in the region. Differential  considerations include right-sided acute diverticulitis, focal area  of  colitis, and inflammatory mass. Consider correlation with direct  visualization following resolution of acute illness.  3. 3.5 cm right adnexal cyst. Ultrasound follow-up/evaluation should  be based on menopausal status.     These results have been discussed at length with patient, she does feel better after receiving pain medication in the ED.  We discussed the risks and benefits of treating her diverticulitis at this time, she will be placed on Cipro, Flagyl.  A short prescription of Percocet has been given for patient, this has been combined with senna to further avoid constipation.  UA does show some nitrites, leukocytes, she does report some changes in her urine which consist midstream stopping.  There is no dysuria, urgency.  Will send UA for culture.  Patient understands and agrees with management, patient stable for discharge.  I have discussed patient with Dr. Dalene Seltzer who agrees with plan and management.   Portions of this note were generated with Scientist, clinical (histocompatibility and immunogenetics). Dictation errors may occur despite best attempts at proofreading.  Final Clinical Impression(s) / ED Diagnoses Final diagnoses:  Right lower quadrant pain  Diverticulitis    Rx / DC Orders ED Discharge Orders         Ordered    metroNIDAZOLE (FLAGYL) 500 MG tablet  3 times daily     08/14/19 2227    ciprofloxacin (CIPRO) 500 MG tablet  2 times daily     08/14/19 2227    oxyCODONE-acetaminophen (PERCOCET/ROXICET) 5-325 MG tablet  Every 4 hours PRN     08/14/19 2227    senna-docusate (SENOKOT-S) 8.6-50 MG tablet  Daily     08/14/19 2227           Claude Manges, PA-C 08/14/19 2231    Alvira Monday, MD 08/15/19 1344

## 2019-08-14 NOTE — ED Triage Notes (Signed)
Per EMS, patient from doctor's office, c/o RLQ pain x3 days. Tender to palpation. Denies V/D but reports nauseas. Decreased appetite.

## 2019-08-14 NOTE — ED Notes (Signed)
Pt refused discharge vital signs

## 2021-03-29 IMAGING — CT CT ABD-PELV W/ CM
2 of 5 series · 16 of 46 positions shown, 18 images · IV contrast (omnipaque)
Comparison: None.

CLINICAL DATA: Right lower quadrant pain

EXAM:
CT ABDOMEN AND PELVIS WITH CONTRAST
TECHNIQUE: Multidetector CT imaging of the abdomen and pelvis was performed
using the standard protocol following bolus administration of
intravenous contrast.
CONTRAST:  100mL OMNIPAQUE IOHEXOL 300 MG/ML  SOLN

[Series 2: axial st · axial · 0.74mm/px · z∈[+988,+1363]mm · 13 of 87 slices shown, 15 images]
[im 6/87  soft-tissue]
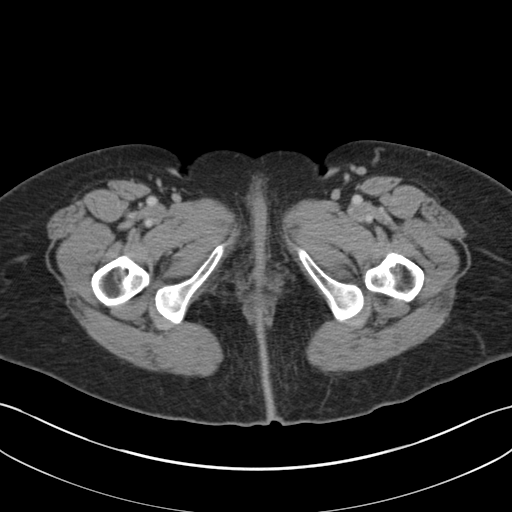
[im 6/87  bone]
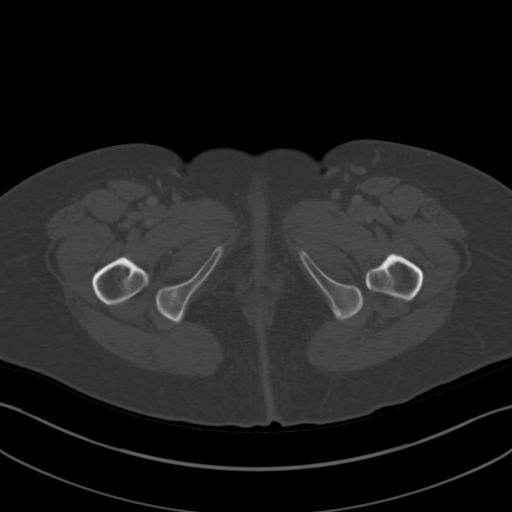
[im 12/87  soft-tissue]
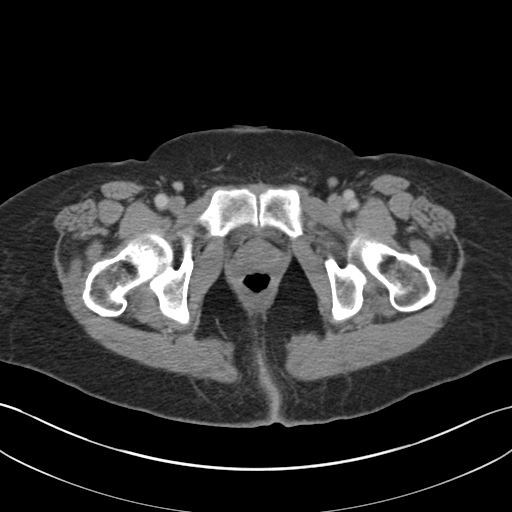
[im 18/87  soft-tissue]
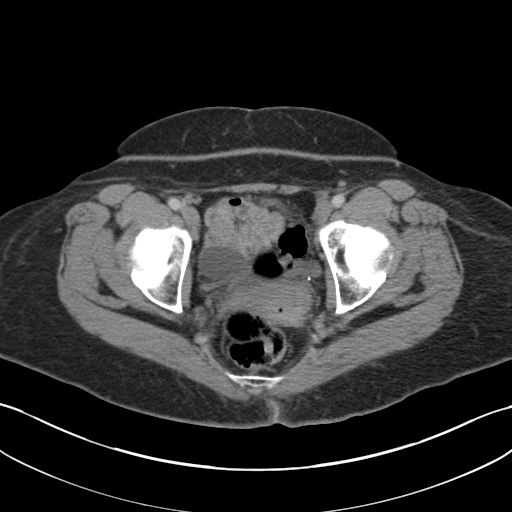
[im 23/87  soft-tissue]
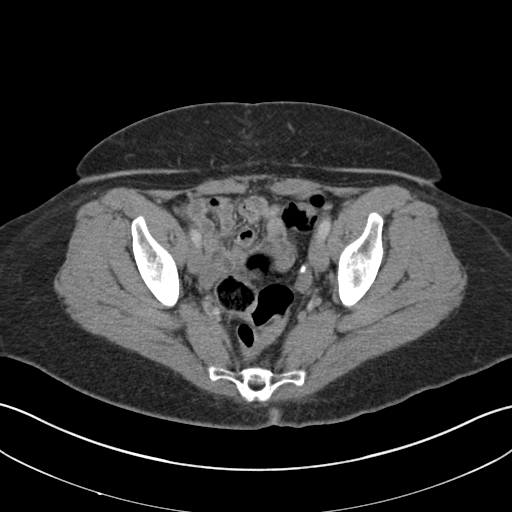
[im 29/87  soft-tissue]
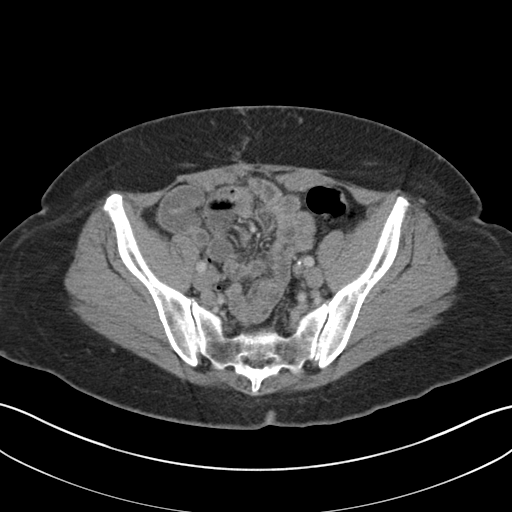
[im 35/87  soft-tissue]
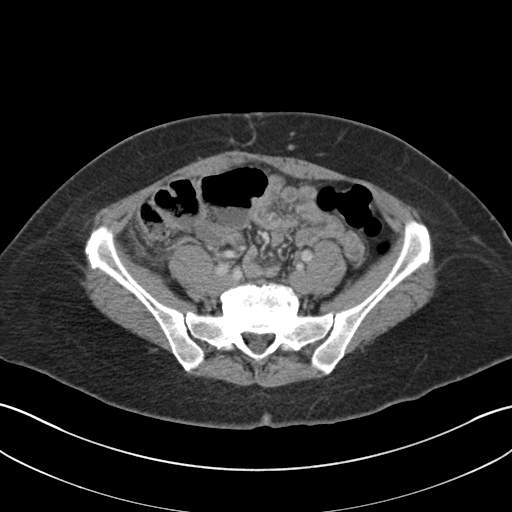
[im 46/87  soft-tissue]
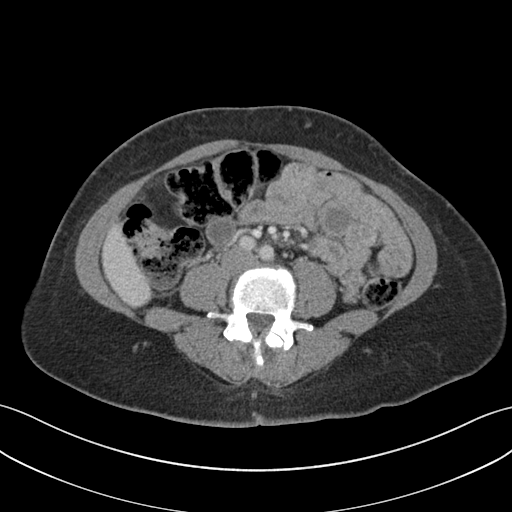
[im 52/87  soft-tissue]
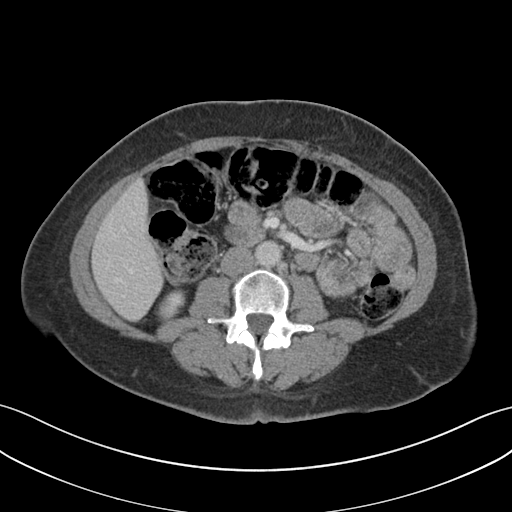
[im 58/87  soft-tissue]
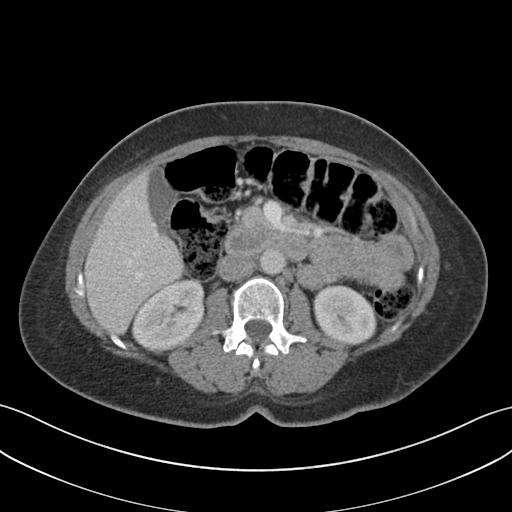
[im 58/87  bone]
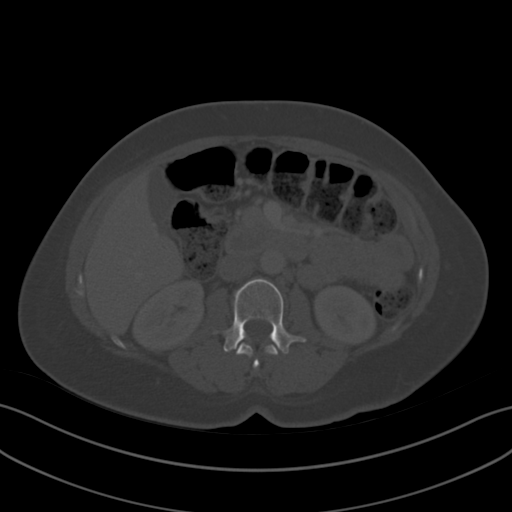
[im 64/87  soft-tissue]
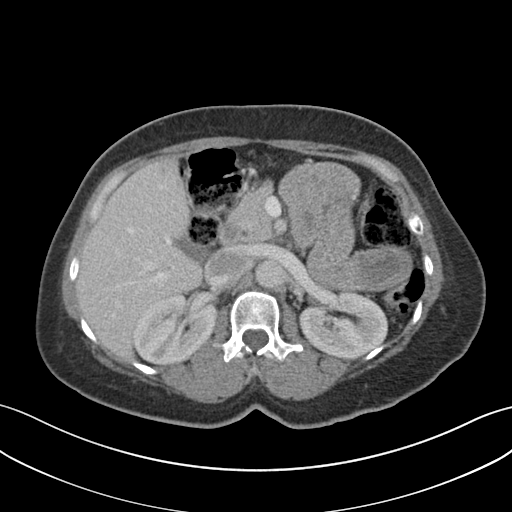
[im 69/87  soft-tissue]
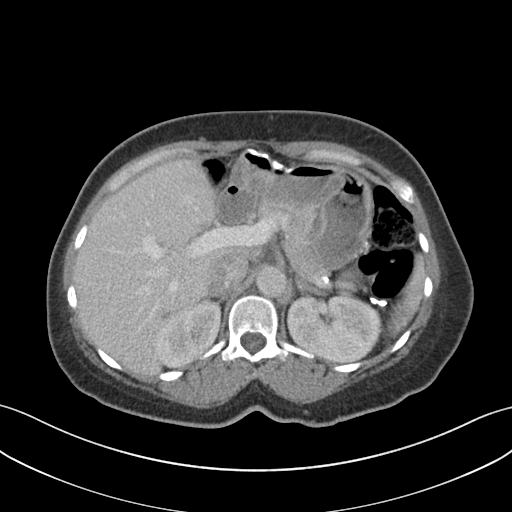
[im 75/87  soft-tissue]
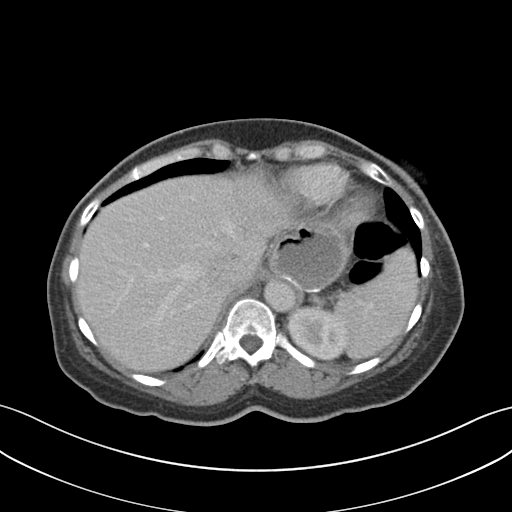
[im 81/87  soft-tissue]
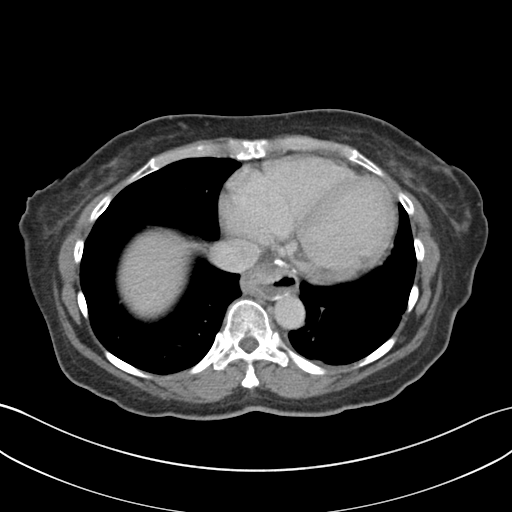

[Series 5: coronal st · coronal · 0.71mm/px · 3 of 123 slices shown]
[im 41/123  soft-tissue]
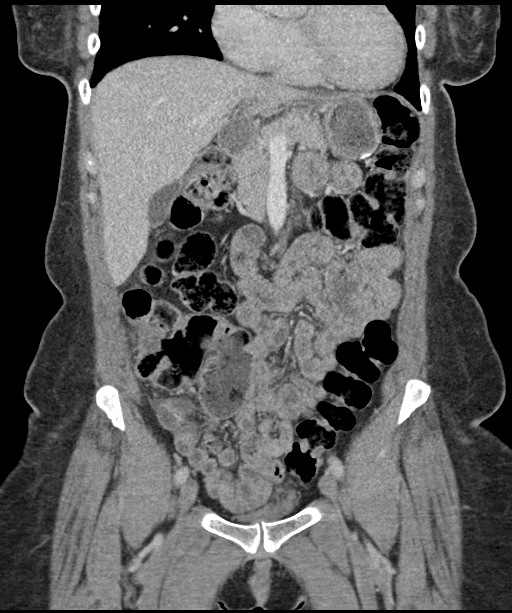
[im 55/123  soft-tissue]
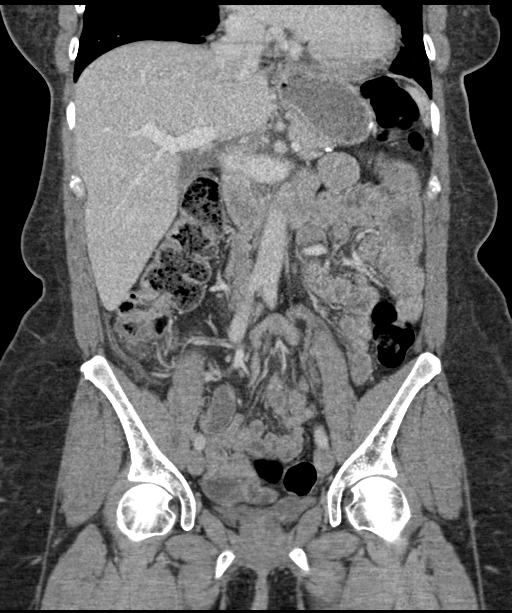
[im 68/123  soft-tissue]
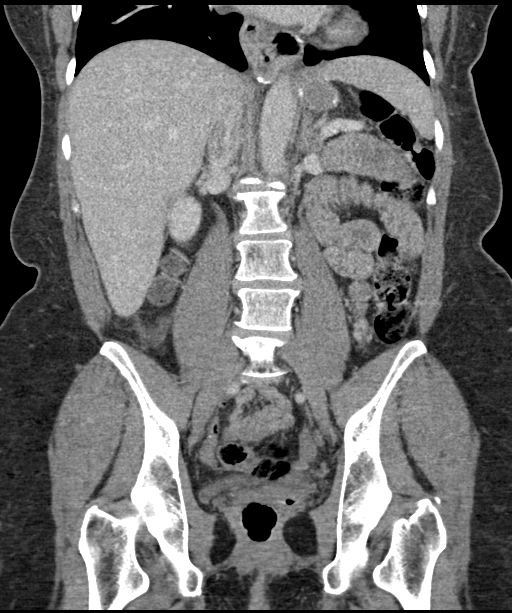

[16 of 46 positions shown; findings below may reference images not displayed]

FINDINGS: Lower chest: Lung bases demonstrate hazy dependent atelectasis. No
consolidation or effusion. Borderline heart size. Small hiatal
hernia.

Hepatobiliary: No focal liver abnormality is seen. No gallstones,
gallbladder wall thickening, or biliary dilatation.

Pancreas: Unremarkable. No pancreatic ductal dilatation or
surrounding inflammatory changes.

Spleen: Normal in size without focal abnormality.

Adrenals/Urinary Tract: Adrenal glands are unremarkable. Kidneys are
normal, without renal calculi, focal lesion, or hydronephrosis.
Bladder is unremarkable.

Stomach/Bowel: Postsurgical changes of the stomach. No dilated small
bowel. Negative appendix. Focal wall thickening involving the
proximal ascending colon with surrounding inflammation. Questionable
diverticulum in the region, series 2, image number 48.

Vascular/Lymphatic: Nonaneurysmal aorta. No significantly enlarged
lymph nodes

Reproductive: Uterus is unremarkable.  3.5 cm right adnexal cyst.

Other: Negative for free air.  Trace fluid in the right gutter

Musculoskeletal: Degenerative changes. No acute or suspicious
osseous abnormality
IMPRESSION: 1. Negative for acute appendicitis.
2. Focal wall thickening at the proximal ascending colon with mild
surrounding inflammatory changes and soft tissue thickening at the
right gutter. Possible diverticulum in the region. Differential
considerations include right-sided acute diverticulitis, focal area
of colitis, and inflammatory mass. Consider correlation with direct
visualization following resolution of acute illness.
3. 3.5 cm right adnexal cyst. Ultrasound follow-up/evaluation should
be based on menopausal status.
